# Patient Record
Sex: Female | Born: 1995 | Race: White | Hispanic: No | Marital: Single | Smoking: Never smoker
Health system: Southern US, Community
[De-identification: ages and names within clinical notes are randomized; demographics above are authoritative.]

---
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
## 2019-04-08 ENCOUNTER — Other Ambulatory Visit: Payer: Self-pay

## 2019-04-08 ENCOUNTER — Ambulatory Visit (HOSPITAL_COMMUNITY)
Admission: RE | Admit: 2019-04-08 | Discharge: 2019-04-08 | Disposition: A | Discharge status: Home / Self Care | Payer: BLUE CROSS/BLUE SHIELD | Source: Home / Self Care | Attending: Psychiatry | Admitting: Psychiatry

## 2019-04-08 DIAGNOSIS — R45851 Suicidal ideations: Secondary | ICD-10-CM | POA: Insufficient documentation

## 2019-04-08 DIAGNOSIS — F419 Anxiety disorder, unspecified: Secondary | ICD-10-CM | POA: Insufficient documentation

## 2019-04-08 DIAGNOSIS — F329 Major depressive disorder, single episode, unspecified: Secondary | ICD-10-CM | POA: Insufficient documentation

## 2019-04-08 DIAGNOSIS — F1210 Cannabis abuse, uncomplicated: Secondary | ICD-10-CM | POA: Diagnosis not present

## 2019-04-08 DIAGNOSIS — Z79899 Other long term (current) drug therapy: Secondary | ICD-10-CM | POA: Insufficient documentation

## 2019-04-08 DIAGNOSIS — Z046 Encounter for general psychiatric examination, requested by authority: Secondary | ICD-10-CM | POA: Diagnosis not present

## 2019-04-08 NOTE — BH Assessment (Signed)
Patient is a walk in at [facility].  Patient meets criteria for inpatient psychiatric hospitalization,  Writer will be transported to [facility] due to no appropriate beds at [facility].  TTS will seek placement.  Writer informed Scientist, research (physical sciences) at [employer].

## 2019-04-09 ENCOUNTER — Encounter (HOSPITAL_COMMUNITY): Payer: Self-pay | Admitting: Emergency Medicine

## 2019-04-09 ENCOUNTER — Emergency Department (HOSPITAL_COMMUNITY)
Admission: EM | Admit: 2019-04-09 | Discharge: 2019-04-09 | Discharge status: Against Medical Advice | Payer: BLUE CROSS/BLUE SHIELD | Attending: Emergency Medicine | Admitting: Emergency Medicine

## 2019-04-09 ENCOUNTER — Other Ambulatory Visit: Payer: Self-pay

## 2019-04-09 DIAGNOSIS — F322 Major depressive disorder, single episode, severe without psychotic features: Secondary | ICD-10-CM | POA: Diagnosis present

## 2019-04-09 DIAGNOSIS — F411 Generalized anxiety disorder: Secondary | ICD-10-CM | POA: Diagnosis present

## 2019-04-09 DIAGNOSIS — R45851 Suicidal ideations: Secondary | ICD-10-CM

## 2019-04-09 DIAGNOSIS — F419 Anxiety disorder, unspecified: Secondary | ICD-10-CM

## 2019-04-09 LAB — COMPREHENSIVE METABOLIC PANEL
ALT: 18 U/L (ref 0–44)
AST: 21 U/L (ref 15–41)
Albumin: 4.8 g/dL (ref 3.5–5.0)
Alkaline Phosphatase: 54 U/L (ref 38–126)
Anion gap: 11 (ref 5–15)
BUN: 12 mg/dL (ref 6–20)
CO2: 23 mmol/L (ref 22–32)
Calcium: 9.9 mg/dL (ref 8.9–10.3)
Chloride: 105 mmol/L (ref 98–111)
Creatinine, Ser: 0.83 mg/dL (ref 0.44–1.00)
GFR calc Af Amer: >60 mL/min (ref >60)
GFR calc non Af Amer: >60 mL/min (ref >60)
Glucose, Bld: 98 mg/dL (ref 70–99)
Potassium: 3.7 mmol/L (ref 3.5–5.1)
Sodium: 139 mmol/L (ref 135–145)
Total Bilirubin: 0.4 mg/dL (ref 0.3–1.2)
Total Protein: 7.9 g/dL (ref 6.5–8.1)

## 2019-04-09 LAB — CBC
HCT: 40.8 % (ref 36.0–46.0)
Hemoglobin: 13.1 g/dL (ref 12.0–15.0)
MCH: 29.8 pg (ref 26.0–34.0)
MCHC: 32.1 g/dL (ref 30.0–36.0)
MCV: 92.9 fL (ref 80.0–100.0)
Platelets: 240 10*3/uL (ref 150–400)
RBC: 4.39 MIL/uL (ref 3.87–5.11)
RDW: 12.5 % (ref 11.5–15.5)
WBC: 10.3 10*3/uL (ref 4.0–10.5)
nRBC: 0 % (ref 0.0–0.2)

## 2019-04-09 LAB — RAPID URINE DRUG SCREEN, HOSP PERFORMED
Amphetamines: NOT DETECTED
Barbiturates: NOT DETECTED
Benzodiazepines: NOT DETECTED
Cocaine: NOT DETECTED
Opiates: NOT DETECTED
Tetrahydrocannabinol: POSITIVE — AB

## 2019-04-09 LAB — ETHANOL: Alcohol, Ethyl (B): <10 mg/dL (ref <10)

## 2019-04-09 LAB — I-STAT BETA HCG BLOOD, ED (MC, WL, AP ONLY): I-stat hCG, quantitative: <5 m[IU]/mL (ref <5)

## 2019-04-09 MED ORDER — HYDROXYZINE HCL 10 MG PO TABS
10.0000 mg | ORAL_TABLET | Freq: Three times a day (TID) | ORAL | Status: DC | PRN
  Administered 2019-04-09: 10 mg via ORAL
  Filled 2019-04-09: qty 1

## 2019-04-09 MED ORDER — TRAZODONE HCL 50 MG PO TABS: 50.0000 mg | ORAL_TABLET | Freq: Every day | ORAL | Status: DC

## 2019-04-09 MED ORDER — LORAZEPAM 0.5 MG PO TABS: 0.5000 mg | ORAL_TABLET | Freq: Three times a day (TID) | ORAL | Status: DC | PRN

## 2019-04-09 MED ORDER — ZOLPIDEM TARTRATE 5 MG PO TABS: 5.0000 mg | ORAL_TABLET | Freq: Every evening | ORAL | Status: DC | PRN

## 2019-04-09 MED ORDER — IBUPROFEN 200 MG PO TABS: 600.0000 mg | ORAL_TABLET | Freq: Three times a day (TID) | ORAL | Status: DC | PRN

## 2019-04-09 MED ORDER — ONDANSETRON HCL 4 MG PO TABS: 4.0000 mg | ORAL_TABLET | Freq: Three times a day (TID) | ORAL | Status: DC | PRN

## 2019-04-09 MED ORDER — FLUOXETINE HCL 10 MG PO CAPS
10.0000 mg | ORAL_CAPSULE | Freq: Every day | ORAL | Status: DC
  Administered 2019-04-09: 10 mg via ORAL
  Filled 2019-04-09: qty 1
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
## 2019-04-09 NOTE — ED Notes (Signed)
Discharge instructions and resources reviewed with pt. Pt verbalizes understanding and states that she will call resources in the morning. Pt was provided with belongings.

## 2019-04-09 NOTE — ED Notes (Signed)
She tells me that the anxiety med I gave her earlier "helped-I feel a little better". I assist her with the making of a phone call at this time.

## 2019-04-09 NOTE — ED Notes (Addendum)
Pt given warm blankets and PO fluids. Pt has call bell within reach. Denies needing anything at this time.

## 2019-04-09 NOTE — ED Provider Notes (Signed)
[facility] DEPT Provider Note   CSN: [id number] Arrival date & time: 04/08/19  2354     History   Chief Complaint Chief Complaint  Patient presents with  . Anxiety  . Depression    HPI Kristina Bates is a 23 y.o. female.     Patient presents to the emergency department after being seen at behavioral health earlier tonight.  She was referred here because it was felt that she required inpatient psychiatric treatment.  Patient reports that she has been experiencing several months of increasing anxiety which causes her "sadness".  She does admit that she has had thoughts of suicide but does not have an active plan currently.     History reviewed. No pertinent past medical history.  There are no active problems to display for this patient.   History reviewed. No pertinent surgical history.   OB History   No obstetric history on file.      Home Medications    Prior to Admission medications   Medication Sig Start Date End Date Taking? Authorizing Provider  meloxicam (MOBIC) 15 MG tablet Take 1 tablet (15 mg total) by mouth daily. 09/02/16   Cuthriell, Charline Bills, PA-C  methocarbamol (ROBAXIN) 500 MG tablet Take 1 tablet (500 mg total) by mouth 4 (four) times daily. 09/02/16   Cuthriell, Charline Bills, PA-C    Family History History reviewed. No pertinent family history.  Social History Social History   Tobacco Use  . Smoking status: Never Smoker  . Smokeless tobacco: Never Used  Substance Use Topics  . Alcohol use: No  . Drug use: Never     Allergies   Patient has no known allergies.   Review of Systems Review of Systems  Psychiatric/Behavioral: Positive for dysphoric mood and suicidal ideas. The patient is nervous/anxious.   All other systems reviewed and are negative.    Physical Exam Updated Vital Signs BP 125/83 (BP Location: Left Arm)   Pulse 80   Temp 98.7 F (37.1 C) (Oral)   Resp 15   Ht 5\' 5"  (1.651 m)   Wt  49.9 kg   LMP 03/18/2019   SpO2 99%   BMI 18.30 kg/m   Physical Exam Vitals signs and nursing note reviewed.  Constitutional:      General: She is not in acute distress.    Appearance: Normal appearance. She is well-developed.  HENT:     Head: Normocephalic and atraumatic.     Right Ear: Hearing normal.     Left Ear: Hearing normal.     Nose: Nose normal.  Eyes:     Conjunctiva/sclera: Conjunctivae normal.     Pupils: Pupils are equal, round, and reactive to light.  Neck:     Musculoskeletal: Normal range of motion and neck supple.  Cardiovascular:     Rate and Rhythm: Regular rhythm.     Heart sounds: S1 normal and S2 normal. No murmur. No friction rub. No gallop.   Pulmonary:     Effort: Pulmonary effort is normal. No respiratory distress.     Breath sounds: Normal breath sounds.  Chest:     Chest wall: No tenderness.  Abdominal:     General: Bowel sounds are normal.     Palpations: Abdomen is soft.     Tenderness: There is no abdominal tenderness. There is no guarding or rebound. Negative signs include Murphy's sign and McBurney's sign.     Hernia: No hernia is present.  Musculoskeletal: Normal range of motion.  Skin:    General: Skin is warm and dry.     Findings: No rash.  Neurological:     Mental Status: She is alert and oriented to person, place, and time.     GCS: GCS eye subscore is 4. GCS verbal subscore is 5. GCS motor subscore is 6.     Cranial Nerves: No cranial nerve deficit.     Sensory: No sensory deficit.     Coordination: Coordination normal.  Psychiatric:        Mood and Affect: Mood is depressed.        Speech: Speech is delayed.        Behavior: Behavior is slowed and withdrawn.        Thought Content: Thought content normal.      ED Treatments / Results  Labs (all labs ordered are listed, but only abnormal results are displayed) Labs Reviewed  RAPID URINE DRUG SCREEN, HOSP PERFORMED - Abnormal; Notable for the following components:       Result Value   Tetrahydrocannabinol POSITIVE (*)    All other components within normal limits  CBC  COMPREHENSIVE METABOLIC PANEL  ETHANOL  I-STAT BETA HCG BLOOD, ED ([facility], [facility], [facility] ONLY)    EKG None  Radiology No results found.  Procedures Procedures (including critical care time)  Medications Ordered in ED Medications - No data to display   Initial Impression / Assessment and Plan / ED Course  I have reviewed the triage vital signs and the nursing notes.  Pertinent labs & imaging results that were available during my care of the patient were reviewed by me and considered in my medical decision making (see chart for details).        Patient with 2 or more month history of increased anxiety, sadness, depression and intermittent suicidal ideation.  She will require psychiatric evaluation and treatment.  Patient medically clear for psychiatric treatment.  Final Clinical Impressions(s) / ED Diagnoses   Final diagnoses:  Anxiety  Suicidal ideation    ED Discharge Orders    None       Pollina, Canary Brim, MD 04/09/19 0121

## 2019-04-09 NOTE — Progress Notes (Signed)
Pt meets inpatient criteria per Lindon Romp, NP. Referral information has been sent to the following hospitals for review:  [facility]       Disposition will continue to assist with inpatient placement needs.   Audree Camel, LCSW, [place] Disposition [facility]/TTS [phone]

## 2019-04-09 NOTE — ED Triage Notes (Signed)
Patient is complaining of anxiety. Patient states she can not sleep. Patient also states she is depressed and really sad. Patient came from behavorial health.

## 2019-04-09 NOTE — Consult Note (Addendum)
Patient reassessed today and continues to meet criteria for inpatient admission.  [facility] does not have available beds.  SW continues to seek accepting facility for placement.  Patient seen face-to-face for psychiatric evaluation, chart reviewed and case discussed with the physician extender and developed treatment plan. Reviewed the information documented and agree with the treatment plan. Corena Pilgrim, MD

## 2019-04-09 NOTE — ED Notes (Signed)
She remains in good spirits and has completed her Telepsych. eval.

## 2019-04-09 NOTE — ED Notes (Signed)
Her skin is normal, warm and dry and she is breathing normally. She is sleeping soundly, which I allow her to continue to do.

## 2019-04-09 NOTE — ED Notes (Signed)
Pt boyfriend asked Probation officer of this note if pt could leave or if she had to stay. Dr Melina Copa was notified, writer of this note and Dr. Melina Copa went into room and discussed options with pt. Pt is still wanting to leave AMA.

## 2019-04-09 NOTE — BH Assessment (Signed)
Show:Clear all [] Manual[x] Template[] Copied  Added by: [x] , LCAS-A  [] Hover for details Tele Assessment Note   Patient Name: Kristina Bates MRN: Clyde Canterbury Location of Patient: Walk In at [facility]  Location of Provider: [facility]    Patient is a 23 year old female.  Patient is a walk in at [facility].  Patient was accompanied by her live-in boyfriend.  Patient endorsed SI without a plan.  Patient reports having a plan to harm herself last month; however, the patient would not tell me what the plan was to harm herself.  Patient reports increased anxiety attacks.  Patient reports irritability and starting arguments with her boyfriend.  Patient reports that everything causes her anxiety.  Patient reports that she cannot identify any specific trauma that is causing her depression.  Patient is a [employer] at 36 n history.  Patient reports that her grades have dropped and she has no motivation to get out of the bed.  Patient denes any family supports.  Patient denies prior inpatient psychiatric hospitalization.  Patient denies HI/Psychos/Substance Abuse.  Patient denies medication management.     Diagnosis: Major Depressive Disorder   Past Medical History: No past medical history on file.  No past surgical history on file.  Family History: No family history on file.  Social History:  reports that she has never smoked. She has never used smokeless tobacco. She reports that she does not drink alcohol or use drugs.  Additional Social History:  Alcohol / Drug Use History of alcohol / drug use?: No history of alcohol / drug abuse  CIWA:   COWS:    Allergies: No Known Allergies  Home Medications: (Not in a hospital admission)   OB/GYN Status:  Patient's last menstrual period was 03/18/2019.  General Assessment Data Location of Assessment: [facility] Assessment Services TTS Assessment: In system Is this a Tele or Face-to-Face Assessment?: Face-to-Face Is  this an Initial Assessment or a Re-assessment for this encounter?: Initial Assessment Patient Accompanied by:: Other Language Other than English: No Living Arrangements: Other (Comment) What gender do you identify as?: Female Marital status: Single Maiden name: NA Pregnancy Status: No Living Arrangements: (Lives with her girlfriend. ) Can pt return to current living arrangement?: Yes Admission Status: Voluntary Is patient capable of signing voluntary admission?: Yes Referral Source: Self/Family/Friend  Medical Screening Exam [facility] Walk-in ONLY) Medical Exam completed: Yes(Jason [employer], NP )  Crisis Care Plan Living Arrangements: (Lives with her girlfriend. ) Legal Guardian: (NA)  Education Status Is patient currently in school?: Yes(Junior at [employer]) Current Grade: 12th Highest grade of school patient has completed: 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 : Yes Suicidal Intent: Yes-Currently Present Has patient had any suicidal intent within the past 6 months prior to admission? : Yes Is patient at risk for suicide?: Yes Suicidal Plan?: Yes-Currently Present Has patient had any suicidal plan within the past 6 months prior to admission? : Yes Specify Current Suicidal Plan: Patient would not state what her plan was Access to Means: Yes Specify Access to Suicidal Means: Patient would not state What has been your use of drugs/alcohol within the last 12 months?: None Reported Previous Attempts/Gestures: Yes How many times?: 0 Other Self Harm Risks: NA Triggers for  Past Attempts: None known Intentional Self Injurious Behavior: None Family Suicide History: No Recent stressful life event(s): Other (Comment) Persecutory  voices/beliefs?: No Depression: Yes Depression Symptoms: Despondent, Insomnia, Tearfulness, Isolating, Fatigue, Guilt, Loss of interest in usual pleasures, Feeling worthless/self pity Substance abuse history and/or treatment for substance abuse?: No Suicide prevention information given to non-admitted patients: Not applicable  Risk to Others within the past 6 months Homicidal Ideation: No Does patient have any lifetime risk of violence toward others beyond the six months prior to admission? : No Thoughts of Harm to Others: No Current Homicidal Intent: No Current Homicidal Plan: No Access to Homicidal Means: No Identified Victim: None Reported History of harm to others?: No Assessment of Violence: None Noted Violent Behavior Description: None Reported Does patient have access to weapons?: No Criminal Charges Pending?: No Does patient have a court date: No Is patient on probation?: No  Psychosis Hallucinations: None noted Delusions: None noted  Mental Status Report Appearance/Hygiene: Disheveled, Layered clothes, Poor hygiene Eye Contact: Poor Motor Activity: Freedom of movement Speech: Logical/coherent Level of Consciousness: Alert Mood: Depressed, Anxious Affect: Anxious, Depressed Anxiety Level: Moderate Thought Processes: Coherent, Relevant Judgement: Unimpaired Orientation: Person, Place, Time, Situation Obsessive Compulsive Thoughts/Behaviors: None  Cognitive Functioning Concentration: Decreased Memory: Recent Intact, Remote Intact Is patient IDD: No Insight: Fair Impulse Control: Poor Appetite: Poor Have you had any weight changes? : No Change Sleep: Increased Total Hours of Sleep: 10 Vegetative Symptoms: Staying in bed, Not bathing, Decreased grooming  ADLScreening [facility] Assessment Services) Patient's cognitive ability adequate to safely complete daily activities?: Yes Patient able to express need for assistance with ADLs?: Yes Independently performs  ADLs?: Yes (appropriate for developmental age)  Prior Inpatient Therapy Prior Inpatient Therapy: No  Prior Outpatient Therapy Prior Outpatient Therapy: No Does patient have an ACCT team?: No Does patient have Intensive In-House Services?  : No Does patient have [facility] services? : No Does patient have P4CC services?: No  ADL Screening (condition at time of admission) Patient's cognitive ability adequate to safely complete daily activities?: Yes Is the patient deaf or have difficulty hearing?: No Does the patient have difficulty seeing, even when wearing glasses/contacts?: No Does the patient have difficulty concentrating, remembering, or making decisions?: Yes Patient able to express need for assistance with ADLs?: Yes Does the patient have difficulty dressing or bathing?: No Independently performs ADLs?: Yes (appropriate for developmental age) Does the patient have difficulty walking or climbing stairs?: No Weakness of Legs: None Weakness of Arms/Hands: None  Home Assistive Devices/Equipment Home Assistive Devices/Equipment: None  Abuse/Neglect Assessment (Assessment to be complete while patient is alone) Abuse/Neglect Assessment Can Be Completed: (None Reported) Advance Directives (For Healthcare) Does Patient Have a Medical Advance Directive?: No Would patient like information on creating a medical advance directive?: No - Patient declined    Disposition: Inpt per Corene Cornea, NP  Disposition Initial Assessment Completed for this Encounter: Yes Disposition of Patient: (Per Corene Cornea, NP - meets criteria for inpt hosp)  This service was provided via telemedicine using a 2-way, interactive audio and video technology.  Names of all persons participating in this telemedicine service and their role in this encounter. Name: Graciella Freer  Role: [place], [place]   Name: Kristina Bates  Role: Patient           Rene Paci 04/09/2019 12:49 AM

## 2019-04-09 NOTE — ED Notes (Signed)
She is awake, alert and oriented. She has eaten breakfast. She denies s.i./h.i. and tells me she is here mainly "for anxiety--it's just getting kinda out of control" [sic]. She is very pleased to get some hot chocolate.

## 2019-04-09 NOTE — ED Notes (Signed)
Pt ambulatory to BR with steady gait. No assist needed.

## 2019-04-09 NOTE — ED Provider Notes (Signed)
I was informed by nursing that the patient would like to be discharged.  She says her anxiety is getting too bad and she needs to be with somebody and cannot stay here any longer.  I reviewed the notes from behavioral health and it sounds like she is considered meeting inpatient criteria.  The patient states she does not want to stay and I do not think I have enough to IVC her.  Will discharge with a list of resources and she understands she can return if she feels any worse.   Hayden Rasmussen, MD 04/09/19 Lurena Nida

## 2019-06-06 ENCOUNTER — Ambulatory Visit: Payer: BLUE CROSS/BLUE SHIELD | Attending: Internal Medicine

## 2019-06-06 DIAGNOSIS — Z20822 Contact with and (suspected) exposure to covid-19: Secondary | ICD-10-CM

## 2019-06-07 LAB — NOVEL CORONAVIRUS, NAA: SARS-CoV-2, NAA: NOT DETECTED

## 2019-06-09 NOTE — L&D Delivery Note (Signed)
Delivery Note  Date of delivery: 01/13/2020 Estimated Date of Delivery: 01/14/20 Patient's last menstrual period was 03/18/2019. EGA: [redacted]w[redacted]d  Delivery Note At 6:45 PM a viable female was delivered via Vaginal, Spontaneous (Presentation: OA).  APGAR: 8, 9; weight 6lb15.8oz (3170g).    Placenta status: Spontaneous, Intact.  Cord: 3 vessels with the following complications:none.  First Stage: Labor onset: 1400 Augmentation : AROM Analgesia /Anesthesia intrapartum: none AROM at 1740  Talbert Cage presented to L&D with active labor.    Second Stage: Complete dilation at 1800 Onset of pushing at 1800 FHR second stage Cat II Delivery at 1845 on 01/13/2020  She progressed to complete and had a spontaneous vaginal birth of a live female over an intact perineum. The fetal head was delivered in OA position with restitution to LOA. No nuchal cord. Anterior then posterior shoulders delivered spontaneously. Baby placed on mom's abdomen and attended to by transition RN. Cord clamped and cut when pulseless by FOB. Cord blood was not obtained for newborn labs.  Third Stage: Placenta delivered  intact with 3VC at 1906 Placenta disposition: Routine disposal Uterine tone firm / bleeding min IV pitocin given for hemorrhage prophylaxis  Left labial laceration identified  Anesthesia for repair: none Repair 3.0 vicryl Est. Blood Loss (mL): 300  Complications: none  Mom to postpartum.  Baby to Couplet care / Skin to Skin.  Newborn: Birth Weight: 6lb15.8oz (3170g)  Apgar Scores: 8, 9 Feeding planned: Breast   Cyril Mourning, CNM 01/13/2020 7:27 PM

## 2019-06-15 ENCOUNTER — Ambulatory Visit: Payer: BLUE CROSS/BLUE SHIELD | Attending: Internal Medicine

## 2019-06-15 DIAGNOSIS — Z20822 Contact with and (suspected) exposure to covid-19: Secondary | ICD-10-CM

## 2019-06-17 LAB — NOVEL CORONAVIRUS, NAA: SARS-CoV-2, NAA: NOT DETECTED

## 2019-07-31 ENCOUNTER — Ambulatory Visit: Payer: Medicaid Other | Attending: Internal Medicine

## 2019-07-31 DIAGNOSIS — Z20822 Contact with and (suspected) exposure to covid-19: Secondary | ICD-10-CM | POA: Insufficient documentation

## 2019-08-01 LAB — NOVEL CORONAVIRUS, NAA: SARS-CoV-2, NAA: NOT DETECTED

## 2020-01-13 ENCOUNTER — Inpatient Hospital Stay
Admission: EM | Admit: 2020-01-13 | Discharge: 2020-01-15 | DRG: 806 | Disposition: A | Discharge status: Home / Self Care | Payer: Medicaid Other | Attending: Obstetrics and Gynecology | Admitting: Obstetrics and Gynecology

## 2020-01-13 DIAGNOSIS — O26893 Other specified pregnancy related conditions, third trimester: Secondary | ICD-10-CM | POA: Diagnosis present

## 2020-01-13 DIAGNOSIS — Z6791 Unspecified blood type, Rh negative: Secondary | ICD-10-CM

## 2020-01-13 DIAGNOSIS — O99824 Streptococcus B carrier state complicating childbirth: Secondary | ICD-10-CM | POA: Diagnosis present

## 2020-01-13 DIAGNOSIS — Z20822 Contact with and (suspected) exposure to covid-19: Secondary | ICD-10-CM | POA: Diagnosis present

## 2020-01-13 DIAGNOSIS — D62 Acute posthemorrhagic anemia: Secondary | ICD-10-CM | POA: Diagnosis not present

## 2020-01-13 DIAGNOSIS — Z3A39 39 weeks gestation of pregnancy: Secondary | ICD-10-CM

## 2020-01-13 DIAGNOSIS — F329 Major depressive disorder, single episode, unspecified: Secondary | ICD-10-CM | POA: Diagnosis present

## 2020-01-13 DIAGNOSIS — O9081 Anemia of the puerperium: Secondary | ICD-10-CM | POA: Diagnosis not present

## 2020-01-13 DIAGNOSIS — O99344 Other mental disorders complicating childbirth: Secondary | ICD-10-CM | POA: Diagnosis present

## 2020-01-13 LAB — CBC
HCT: 38 % (ref 36.0–46.0)
Hemoglobin: 12.5 g/dL (ref 12.0–15.0)
MCH: 29.7 pg (ref 26.0–34.0)
MCHC: 32.9 g/dL (ref 30.0–36.0)
MCV: 90.3 fL (ref 80.0–100.0)
Platelets: 240 10*3/uL (ref 150–400)
RBC: 4.21 MIL/uL (ref 3.87–5.11)
RDW: 15 % (ref 11.5–15.5)
WBC: 14.9 10*3/uL — ABNORMAL HIGH (ref 4.0–10.5)
nRBC: 0 % (ref 0.0–0.2)

## 2020-01-13 LAB — TYPE AND SCREEN
ABO/RH(D): A NEG
Antibody Screen: POSITIVE

## 2020-01-13 LAB — SARS CORONAVIRUS 2 BY RT PCR (HOSPITAL ORDER, PERFORMED IN ~~LOC~~ HOSPITAL LAB): SARS Coronavirus 2: NEGATIVE

## 2020-01-13 MED ORDER — LIDOCAINE HCL (PF) 1 % IJ SOLN
INTRAMUSCULAR | Status: AC
  Filled 2020-01-13: qty 30

## 2020-01-13 MED ORDER — ACETAMINOPHEN 325 MG PO TABS: 650.0000 mg | ORAL_TABLET | ORAL | Status: DC | PRN

## 2020-01-13 MED ORDER — OXYCODONE HCL 5 MG PO TABS
5.0000 mg | ORAL_TABLET | ORAL | Status: DC | PRN
  Administered 2020-01-13: 5 mg via ORAL
  Filled 2020-01-13: qty 1

## 2020-01-13 MED ORDER — OXYCODONE HCL 5 MG PO TABS: 10.0000 mg | ORAL_TABLET | ORAL | Status: DC | PRN

## 2020-01-13 MED ORDER — WITCH HAZEL-GLYCERIN EX PADS
1.0000 "application " | MEDICATED_PAD | CUTANEOUS | Status: DC | PRN
  Filled 2020-01-13 (×2): qty 100

## 2020-01-13 MED ORDER — LACTATED RINGERS IV SOLN: INTRAVENOUS | Status: DC

## 2020-01-13 MED ORDER — COCONUT OIL OIL: 1.0000 "application " | TOPICAL_OIL | Status: DC | PRN

## 2020-01-13 MED ORDER — ONDANSETRON HCL 4 MG PO TABS
4.0000 mg | ORAL_TABLET | ORAL | Status: DC | PRN
  Filled 2020-01-13: qty 1

## 2020-01-13 MED ORDER — OXYCODONE-ACETAMINOPHEN 5-325 MG PO TABS: 2.0000 | ORAL_TABLET | ORAL | Status: DC | PRN

## 2020-01-13 MED ORDER — SOD CITRATE-CITRIC ACID 500-334 MG/5ML PO SOLN: 30.0000 mL | ORAL | Status: DC | PRN

## 2020-01-13 MED ORDER — DIPHENHYDRAMINE HCL 25 MG PO CAPS: 25.0000 mg | ORAL_CAPSULE | Freq: Four times a day (QID) | ORAL | Status: DC | PRN

## 2020-01-13 MED ORDER — SIMETHICONE 80 MG PO CHEW: 80.0000 mg | CHEWABLE_TABLET | ORAL | Status: DC | PRN

## 2020-01-13 MED ORDER — AMMONIA AROMATIC IN INHA
RESPIRATORY_TRACT | Status: AC
  Filled 2020-01-13: qty 10

## 2020-01-13 MED ORDER — ONDANSETRON HCL 4 MG/2ML IJ SOLN: 4.0000 mg | INTRAMUSCULAR | Status: DC | PRN

## 2020-01-13 MED ORDER — OXYTOCIN BOLUS FROM INFUSION: 333.0000 mL | Freq: Once | INTRAVENOUS | Status: DC

## 2020-01-13 MED ORDER — DIBUCAINE (PERIANAL) 1 % EX OINT
1.0000 "application " | TOPICAL_OINTMENT | CUTANEOUS | Status: DC | PRN
  Filled 2020-01-13 (×2): qty 28

## 2020-01-13 MED ORDER — ONDANSETRON HCL 4 MG/2ML IJ SOLN: 4.0000 mg | Freq: Four times a day (QID) | INTRAMUSCULAR | Status: DC | PRN

## 2020-01-13 MED ORDER — OXYTOCIN 10 UNIT/ML IJ SOLN
INTRAMUSCULAR | Status: AC
  Administered 2020-01-13: 10 [IU]
  Filled 2020-01-13: qty 2

## 2020-01-13 MED ORDER — OXYTOCIN-SODIUM CHLORIDE 30-0.9 UT/500ML-% IV SOLN: 2.5000 [IU]/h | INTRAVENOUS | Status: DC

## 2020-01-13 MED ORDER — TETANUS-DIPHTH-ACELL PERTUSSIS 5-2.5-18.5 LF-MCG/0.5 IM SUSP
0.5000 mL | Freq: Once | INTRAMUSCULAR | Status: DC
  Filled 2020-01-13: qty 0.5

## 2020-01-13 MED ORDER — FERROUS SULFATE 325 (65 FE) MG PO TABS
325.0000 mg | ORAL_TABLET | Freq: Two times a day (BID) | ORAL | Status: DC
  Administered 2020-01-14 – 2020-01-15 (×3): 325 mg via ORAL
  Filled 2020-01-13 (×3): qty 1

## 2020-01-13 MED ORDER — DOCUSATE SODIUM 100 MG PO CAPS
100.0000 mg | ORAL_CAPSULE | Freq: Two times a day (BID) | ORAL | Status: DC
  Administered 2020-01-13 – 2020-01-14 (×2): 100 mg via ORAL
  Filled 2020-01-13 (×4): qty 1

## 2020-01-13 MED ORDER — IBUPROFEN 600 MG PO TABS
600.0000 mg | ORAL_TABLET | Freq: Four times a day (QID) | ORAL | Status: DC
  Administered 2020-01-13 – 2020-01-15 (×8): 600 mg via ORAL
  Filled 2020-01-13 (×8): qty 1

## 2020-01-13 MED ORDER — LACTATED RINGERS IV SOLN: 500.0000 mL | INTRAVENOUS | Status: DC | PRN

## 2020-01-13 MED ORDER — PRENATAL MULTIVITAMIN CH
1.0000 | ORAL_TABLET | Freq: Every day | ORAL | Status: DC
  Administered 2020-01-14 – 2020-01-15 (×2): 1 via ORAL
  Filled 2020-01-13 (×2): qty 1

## 2020-01-13 MED ORDER — OXYCODONE-ACETAMINOPHEN 5-325 MG PO TABS: 1.0000 | ORAL_TABLET | ORAL | Status: DC | PRN

## 2020-01-13 MED ORDER — LIDOCAINE HCL (PF) 1 % IJ SOLN
30.0000 mL | INTRAMUSCULAR | Status: DC | PRN
  Filled 2020-01-13: qty 30

## 2020-01-13 MED ORDER — BENZOCAINE-MENTHOL 20-0.5 % EX AERO
1.0000 "application " | INHALATION_SPRAY | CUTANEOUS | Status: DC | PRN
  Filled 2020-01-13: qty 56

## 2020-01-13 MED ORDER — MISOPROSTOL 200 MCG PO TABS
ORAL_TABLET | ORAL | Status: AC
  Filled 2020-01-13: qty 4

## 2020-01-13 MED ORDER — ACETAMINOPHEN 325 MG PO TABS
650.0000 mg | ORAL_TABLET | ORAL | Status: DC | PRN
  Administered 2020-01-13: 650 mg via ORAL
  Filled 2020-01-13: qty 2

## 2020-01-13 NOTE — Discharge Summary (Signed)
Obstetrical Discharge Summary  Patient Name: Kristina Bates DOB: Mar 04, 1996 MRN: [id number]  Date of Admission: 01/13/2020 Date of Delivery: 01/13/20 Delivered by: Haroldine Laws Date of Discharge: 01/15/2020  Primary OB: [facility]  [id number] last menstrual period was 03/18/2019. EDC Estimated Date of Delivery: 01/14/20 Gestational Age at Delivery: [redacted]w[redacted]d   Antepartum complications:  1. GBS Positive 2. Needs PP pap smear 3. Late to care at 31wks 4. Depression - stopped taking prenatally  5. Underweight pre- preg BMI 18 6. Chlamydia tx sent 6/9 7. RH Negative 8. Anemia Admitting Diagnosis: active labor Secondary Diagnosis: Patient Active Problem List   Diagnosis Date Noted  . NSVD (normal spontaneous vaginal delivery) 01/13/2020  . Generalized anxiety disorder 04/09/2019  . Major depressive disorder, single episode, severe (HCC) 04/09/2019    Augmentation: N/A Complications: None  Intrapartum complications/course:  Delivery Type: spontaneous vaginal delivery Anesthesia: none Placenta: spontaneous Laceration: left labial Episiotomy: none Newborn Data: Live born female  Birth Weight:  6lb15.8oz (3170g)  APGAR: 8, 9  "Kristina Bates"  Newborn Delivery   Birth date/time: 01/13/2020 18:45:00 Delivery type: Vaginal, Spontaneous      24yo [id number] at 39+4wks presenting with active labor, AROM with clear fluid.  She progressed to complete and pushed over an intact perineum and delivered the fetal head, followed promptly by the shoulders. She was in control the whole time, and the baby placed on the maternal abdomen. Delayed cord clamping and the FOB cut his cord, while he was skin to skin. The placenta delivered spontaneously and intact. Left labial laceration noted and repaired. Mom and baby tolerated the procedure well.   Postpartum Procedures: None  Post partum course:  Patient had an uncomplicated postpartum course.  By time of discharge on PPD#2, her pain was  controlled on oral pain medications; she had appropriate lochia and was ambulating, voiding without difficulty and tolerating regular diet.  TOC evaluated for resources and concerns for postpartum mood risk factors.  She declined to stay for psychiatry consult and was given resources for counselors.  will start back on Prozac postpartum.  She was deemed stable for discharge to home.    Discharge Physical Exam:  BP 102/71 (BP Location: Left Arm)   Pulse 79   Temp 98.3 F (36.8 C) (Oral)   Resp 18   LMP 03/18/2019   SpO2 100%   Breastfeeding Unknown   General: alert and no distress Pulm: normal respiratory effort Lochia: appropriate Abdomen: soft, NT Uterine Fundus: firm, below umbilicus Extremities: No evidence of DVT seen on physical exam. No lower extremity edema. EdinburghInocente Salles Postnatal Depression Scale Screening Tool 01/14/2020  I have been able to laugh and see the funny side of things. 0  I have looked forward with enjoyment to things. 0  I have blamed myself unnecessarily when things went wrong. 0  I have been anxious or worried for no good reason. 0  I have felt scared or panicky for no good reason. 1  Things have been getting on top of me. 0  I have been so unhappy that I have had difficulty sleeping. 0  I have felt sad or miserable. 0  I have been so unhappy that I have been crying. 1  The thought of harming myself has occurred to me. 0  Edinburgh Postnatal Depression Scale Total 2     Labs: CBC Latest Ref Rng & Units 01/14/2020 01/13/2020 04/09/2019  WBC 4.0 - 10.5 K/uL 12.1(H) 14.9(H) 10.3  Hemoglobin 12.0 - 15.0 g/dL 11.4(L) 12.5  13.1  Hematocrit 36 - 46 % 34.8(L) 38.0 40.8  Platelets 150 - 400 K/uL 224 240 240   A NEG Hemoglobin  Date Value Ref Range Status  01/14/2020 11.4 (L) 12.0 - 15.0 g/dL Final   HCT  Date Value Ref Range Status  01/14/2020 34.8 (L) 36 - 46 % Final    Disposition: stable, discharge to home Baby Feeding: breastmilk Baby  Disposition: home with mom  Contraception: birth control pills though still considering options   Prenatal Labs:  Blood type/Rh A neg  Antibody screen neg  Rubella Immune  Varicella Immune  RPR NR  HBsAg Neg  HIV NR  GC neg  Chlamydia Pos on 6/9, neg on 7/12  Genetic screening negative  1 hour GTT   3 hour GTT   GBS pos    Rh Immune globulin given: Mom A neg/Infant A pos - Rhogam given 01/15/2020 Rubella vaccine given: n/a Varicella vaccine given: n/a Tdap vaccine given in AP or PP setting: given 11/13/19 Flu vaccine given in AP or PP setting: Declined  Plan: Kristina Bates was discharged to home in good condition. Follow-up appointment with delivering provider in 2 weeks for mood check.  Discharge Instructions: Per After Visit Summary. Activity: Advance as tolerated. Pelvic rest for 6 weeks.   Diet: Regular Discharge Medications: Allergies as of 01/15/2020   No Known Allergies     Medication List    STOP taking these medications   meloxicam 15 MG tablet Commonly known as: MOBIC   methocarbamol 500 MG tablet Commonly known as: Robaxin     TAKE these medications   acetaminophen 325 MG tablet Commonly known as: Tylenol Take 2 tablets (650 mg total) by mouth every 4 (four) hours as needed (for pain scale < 4).   FLUoxetine 20 MG capsule Commonly known as: PROZAC Take 1 capsule (20 mg total) by mouth daily.   ibuprofen 600 MG tablet Commonly known as: ADVIL Take 1 tablet (600 mg total) by mouth every 6 (six) hours as needed for mild pain or moderate pain.   prenatal vitamin w/FE, FA 29-1 MG Chew chewable tablet Chew 1 tablet by mouth daily at 12 noon.      Outpatient follow up:   Follow-up Information    Haroldine Laws, CNM. Schedule an appointment as soon as possible for a visit in 2 week(s).   Specialty: Certified Nurse Midwife Why: Please call to schedule a 2 week mood check appointment and 6 week postpartum appointment Contact information: [address] [place] [address] [phone]               Signed:  Margaretmary Eddy, CNM Certified Nurse Midwife [facility] [place]

## 2020-01-13 NOTE — OB Triage Note (Signed)
Patient here for contractions that started a few hours ago, every 4-5 mins, is having some bleeding. Denies LOF

## 2020-01-13 NOTE — Plan of Care (Signed)
Patient delivered baby girl "[name]" vaginally without complications. Patients only complaint at this time is pain in perineal area. Patient given scheduled and PRN medications. Patient made aware of scheduled medications and to request PRN medications as needed. Patient educated on the need to urinate, is unable to urinate at this time. FOB ([place]) at bedside for support.

## 2020-01-13 NOTE — H&P (Signed)
OB History & Physical   History of Present Illness:  Chief Complaint:   HPI:  Kristina Bates is a 24 y.o. G24P1001 female at [redacted]w[redacted]d dated by LMP.  She presents to L&D for active labor  She reports:  -active fetal movement -no leakage of fluid -bloody show upon arrival -onset of contractions at 1400 currently every 4-5 minutes  Pregnancy Issues: 1. GBS Positive 2. Needs PP pap smear 3. Late to care at 31wks 4. Depression   on Prozac 40mg  PO daily 5. Underweight pre- preg BMI 18 6. Chlamydia tx sent 6/9 7. RH Negative 8. Anemia  Maternal Medical History:  History reviewed. No pertinent past medical history.  History reviewed. No pertinent surgical history.  No Known Allergies  Prior to Admission medications   Medication Sig Start Date End Date Taking? Authorizing Provider  meloxicam (MOBIC) 15 MG tablet Take 1 tablet (15 mg total) by mouth daily. 09/02/16   Cuthriell, 09/04/16, PA-C  methocarbamol (ROBAXIN) 500 MG tablet Take 1 tablet (500 mg total) by mouth 4 (four) times daily. 09/02/16   Cuthriell, 09/04/16, PA-C     Prenatal care site: [facility]   Social History: She  reports that she has never smoked. She has never used smokeless tobacco. She reports that she does not drink alcohol and does not use drugs.  Family History: family history is not on file.   Review of Systems: A full review of systems was performed and negative except as noted in the HPI.    Physical Exam:  Vital Signs: BP 113/62   Pulse 71   LMP 03/18/2019   Breastfeeding Unknown   General:   alert and cooperative  Skin:  normal  Neurologic:    Alert & oriented x 3  Lungs:   nl effort  Heart:   regular rate and rhythm  Abdomen:  normal findings: soft, non-tender  Extremities: : non-tender, symmetric     Results for orders placed or performed during the hospital encounter of 01/13/20 (from the past 24 hour(s))  CBC     Status: Abnormal   Collection Time: 01/13/20  7:50 PM   Result Value Ref Range   WBC 14.9 (H) 4.0 - 10.5 K/uL   RBC 4.21 3.87 - 5.11 MIL/uL   Hemoglobin 12.5 12.0 - 15.0 g/dL   HCT 03/14/20 36 - 46 %   MCV 90.3 80.0 - 100.0 fL   MCH 29.7 26.0 - 34.0 pg   MCHC 32.9 30.0 - 36.0 g/dL   RDW 16.1 09.6 - 04.5 %   Platelets 240 150 - 400 K/uL   nRBC 0.0 0.0 - 0.2 %  Type and screen [facility]     Status: None (Preliminary result)   Collection Time: 01/13/20  7:50 PM  Result Value Ref Range   ABO/RH(D) PENDING    Antibody Screen PENDING    Sample Expiration      01/16/2020,2359 Performed at [facility], [address] [place], [place]     Pertinent Results:  Prenatal Labs: Blood type/Rh A neg  Antibody screen neg  Rubella Immune  Varicella Immune  RPR NR  HBsAg Neg  HIV NR  GC neg  Chlamydia Pos on 6/9, neg on 7/12  Genetic screening negative  1 hour GTT   3 hour GTT   GBS pos   FHT: FHR: 135 bpm, variability: moderate,  accelerations:  Present,  decelerations:  Absent  Category/reactivity:  Category I TOCO: regular, every  2-4 minutes   Cephalic by SVE   Assessment:  Kristina Bates is a 24 y.o. G39P1001 female at [redacted]w[redacted]d with active labor.   Plan:  1. Admit to Labor & Delivery; consents reviewed and obtained  2. Fetal Well being  - Fetal Tracing: Cat I - GBS pos - Presentation: vtx confirmed by sve   3. Routine OB: - Prenatal labs reviewed, as above - Rh neg - CBC & T&S on admit - saline lock  4. Monitoring of Labor -  Contractions external toco in place -  Plan for continuous fetal monitoring  -  Maternal pain control as desired: IVPM, nitrous, regional anesthesia - Anticipate vaginal delivery  5. Post Partum Planning: - Infant feeding: Breast - Contraception: TBD - Tdap 11/13/19 - Flu declined  Jeyden Coffelt, CNM 01/13/2020 8:26 PM

## 2020-01-13 NOTE — Discharge Instructions (Signed)
Postpartum Care After Vaginal Delivery This sheet gives you information about how to care for yourself from the time you deliver your baby to up to 6-12 weeks after delivery (postpartum period). Your health care provider may also give you more specific instructions. If you have problems or questions, contact your health care provider. Follow these instructions at home: Vaginal bleeding  It is normal to have vaginal bleeding (lochia) after delivery. Wear a sanitary pad for vaginal bleeding and discharge. ? During the first week after delivery, the amount and appearance of lochia is often similar to a menstrual period. ? Over the next few weeks, it will gradually decrease to a dry, yellow-brown discharge. ? For most women, lochia stops completely by 4-6 weeks after delivery. Vaginal bleeding can vary from woman to woman.  Change your sanitary pads frequently. Watch for any changes in your flow, such as: ? A sudden increase in volume. ? A change in color. ? Large blood clots.  If you pass a blood clot from your vagina, save it and call your health care provider to discuss. Do not flush blood clots down the toilet before talking with your health care provider.  Do not use tampons or douches until your health care provider says this is safe.  If you are not breastfeeding, your period should return 6-8 weeks after delivery. If you are feeding your child breast milk only (exclusive breastfeeding), your period may not return until you stop breastfeeding. Perineal care  Keep the area between the vagina and the anus (perineum) clean and dry as told by your health care provider. Use medicated pads and pain-relieving sprays and creams as directed.  If you had a cut in the perineum (episiotomy) or a tear in the vagina, check the area for signs of infection until you are healed. Check for: ? More redness, swelling, or pain. ? Fluid or blood coming from the cut or tear. ? Warmth. ? Pus or a bad  smell.  You may be given a squirt bottle to use instead of wiping to clean the perineum area after you go to the bathroom. As you start healing, you may use the squirt bottle before wiping yourself. Make sure to wipe gently.  To relieve pain caused by an episiotomy, a tear in the vagina, or swollen veins in the anus (hemorrhoids), try taking a warm sitz bath 2-3 times a day. A sitz bath is a warm water bath that is taken while you are sitting down. The water should only come up to your hips and should cover your buttocks. Breast care  Within the first few days after delivery, your breasts may feel heavy, full, and uncomfortable (breast engorgement). Milk may also leak from your breasts. Your health care provider can suggest ways to help relieve the discomfort. Breast engorgement should go away within a few days.  If you are breastfeeding: ? Wear a bra that supports your breasts and fits you well. ? Keep your nipples clean and dry. Apply creams and ointments as told by your health care provider. ? You may need to use breast pads to absorb milk that leaks from your breasts. ? You may have uterine contractions every time you breastfeed for up to several weeks after delivery. Uterine contractions help your uterus return to its normal size. ? If you have any problems with breastfeeding, work with your health care provider or lactation consultant.  If you are not breastfeeding: ? Avoid touching your breasts a lot. Doing this can make   your breasts produce more milk. ? Wear a good-fitting bra and use cold packs to help with swelling. ? Do not squeeze out (express) milk. This causes you to make more milk. Intimacy and sexuality  Ask your health care provider when you can engage in sexual activity. This may depend on: ? Your risk of infection. ? How fast you are healing. ? Your comfort and desire to engage in sexual activity.  You are able to get pregnant after delivery, even if you have not had  your period. If desired, talk with your health care provider about methods of birth control (contraception). Medicines  Take over-the-counter and prescription medicines only as told by your health care provider.  If you were prescribed an antibiotic medicine, take it as told by your health care provider. Do not stop taking the antibiotic even if you start to feel better. Activity  Gradually return to your normal activities as told by your health care provider. Ask your health care provider what activities are safe for you.  Rest as much as possible. Try to rest or take a nap while your baby is sleeping. Eating and drinking   Drink enough fluid to keep your urine pale yellow.  Eat high-fiber foods every day. These may help prevent or relieve constipation. High-fiber foods include: ? Whole grain cereals and breads. ? Brown rice. ? Beans. ? Fresh fruits and vegetables.  Do not try to lose weight quickly by cutting back on calories.  Take your prenatal vitamins until your postpartum checkup or until your health care provider tells you it is okay to stop. Lifestyle  Do not use any products that contain nicotine or tobacco, such as cigarettes and e-cigarettes. If you need help quitting, ask your health care provider.  Do not drink alcohol, especially if you are breastfeeding. General instructions  Keep all follow-up visits for you and your baby as told by your health care provider. Most women visit their health care provider for a postpartum checkup within the first 3-6 weeks after delivery. Contact a health care provider if:  You feel unable to cope with the changes that your child brings to your life, and these feelings do not go away.  You feel unusually sad or worried.  Your breasts become red, painful, or hard.  You have a fever.  You have trouble holding urine or keeping urine from leaking.  You have little or no interest in activities you used to enjoy.  You have not  breastfed at all and you have not had a menstrual period for 12 weeks after delivery.  You have stopped breastfeeding and you have not had a menstrual period for 12 weeks after you stopped breastfeeding.  You have questions about caring for yourself or your baby.  You pass a blood clot from your vagina. Get help right away if:  You have chest pain.  You have difficulty breathing.  You have sudden, severe leg pain.  You have severe pain or cramping in your lower abdomen.  You bleed from your vagina so much that you fill more than one sanitary pad in one hour. Bleeding should not be heavier than your heaviest period.  You develop a severe headache.  You faint.  You have blurred vision or spots in your vision.  You have bad-smelling vaginal discharge.  You have thoughts about hurting yourself or your baby. If you ever feel like you may hurt yourself or others, or have thoughts about taking your own life, get help   right away. You can go to the nearest emergency department or call:  Your local emergency services (911 in the [place]  A suicide crisis helpline, such as the National Suicide Prevention Lifeline at 1-800-273-8255. This is open 24 hours a day. Summary  The period of time right after you deliver your newborn up to 6-12 weeks after delivery is called the postpartum period.  Gradually return to your normal activities as told by your health care provider.  Keep all follow-up visits for you and your baby as told by your health care provider. This information is not intended to replace advice given to you by your health care provider. Make sure you discuss any questions you have with your health care provider. Document Revised: 05/28/2017 Document Reviewed: 03/08/2017 Elsevier Patient Education  2020 Else[employer] Inc[employer]  Postpartum Baby Blues The postpartum period begins right after the birth of a baby. During this time, there is often a lot of joy and excitement. It is also a  time of many changes in the life of the parents. No matter how many times a mother gives birth, each child brings new challenges to the family, including different ways of relating to one another. It is common to have feelings of excitement along with confusing changes in moods, emotions, and thoughts. You may feel happy one minute and sad or stressed the next. These feelings of sadness usually happen in the period right after you have your baby, and they go away within a week or two. This is called the "baby blues." What are the causes? There is no known cause of baby blues. It is likely caused by a combination of factors. However, changes in hormone levels after childbirth are believed to trigger some of the symptoms. Other factors that can play a role in these mood changes include:  Lack of sleep.  Stressful life events, such as poverty, caring for a loved one, or death of a loved one.  Genetics. What are the signs or symptoms? Symptoms of this condition include:  Brief changes in mood, such as going from extreme happiness to sadness.  Decreased concentration.  Difficulty sleeping.  Crying spells and tearfulness.  Loss of appetite.  Irritability.  Anxiety. If the symptoms of baby blues last for more than 2 weeks or become more severe, you may have postpartum depression. How is this diagnosed? This condition is diagnosed based on an evaluation of your symptoms. There are no medical or lab tests that lead to a diagnosis, but there are various questionnaires that a health care provider may use to identify women with the baby blues or postpartum depression. How is this treated? Treatment is not needed for this condition. The baby blues usually go away on their own in 1-2 weeks. Social support is often all that is needed. You will be encouraged to get adequate sleep and rest. Follow these instructions at home: Lifestyle      Get as much rest as you can. Take a nap when the baby  sleeps.  Exercise regularly as told by your health care provider. Some women find yoga and walking to be helpful.  Eat a balanced and nourishing diet. This includes plenty of fruits and vegetables, whole grains, and lean proteins.  Do little things that you enjoy. Have a cup of tea, take a bubble bath, read your favorite magazine, or listen to your favorite music.  Avoid alcohol.  Ask for help with household chores, cooking, grocery shopping, or running errands. Do not try to   do everything yourself. Consider hiring a postpartum doula to help. This is a professional who specializes in providing support to new mothers.  Try not to make any major life changes during pregnancy or right after giving birth. This can add stress. General instructions  Talk to people close to you about how you are feeling. Get support from your partner, family members, friends, or other new moms. You may want to join a support group.  Find ways to cope with stress. This may include: ? Writing your thoughts and feelings in a journal. ? Spending time outside. ? Spending time with people who make you laugh.  Try to stay positive in how you think. Think about the things you are grateful for.  Take over-the-counter and prescription medicines only as told by your health care provider.  Let your health care provider know if you have any concerns.  Keep all postpartum visits as told by your health care provider. This is important. Contact a health care provider if:  Your baby blues do not go away after 2 weeks. Get help right away if:  You have thoughts of taking your own life (suicidal thoughts).  You think you may harm the baby or other people.  You see or hear things that are not there (hallucinations). Summary  After giving birth, you may feel happy one minute and sad or stressed the next. Feelings of sadness that happen right after the baby is born and go away after a week or two are called the "baby  blues."  You can manage the baby blues by getting enough rest, eating a healthy diet, exercising, spending time with supportive people, and finding ways to cope with stress.  If feelings of sadness and stress last longer than 2 weeks or get in the way of caring for your baby, talk to your health care provider. This may mean you have postpartum depression. This information is not intended to replace advice given to you by your health care provider. Make sure you discuss any questions you have with your health care provider. Document Revised: 09/16/2018 Document Reviewed: 07/21/2016 Elsevier Patient Education  2020 Elsevier Inc.  

## 2020-01-14 ENCOUNTER — Other Ambulatory Visit: Payer: Self-pay

## 2020-01-14 LAB — RPR: RPR Ser Ql: NONREACTIVE

## 2020-01-14 LAB — FETAL SCREEN: Fetal Screen: NEGATIVE

## 2020-01-14 LAB — CBC
HCT: 34.8 % — ABNORMAL LOW (ref 36.0–46.0)
Hemoglobin: 11.4 g/dL — ABNORMAL LOW (ref 12.0–15.0)
MCH: 29.3 pg (ref 26.0–34.0)
MCHC: 32.8 g/dL (ref 30.0–36.0)
MCV: 89.5 fL (ref 80.0–100.0)
Platelets: 224 10*3/uL (ref 150–400)
RBC: 3.89 MIL/uL (ref 3.87–5.11)
RDW: 15.4 % (ref 11.5–15.5)
WBC: 12.1 10*3/uL — ABNORMAL HIGH (ref 4.0–10.5)
nRBC: 0 % (ref 0.0–0.2)

## 2020-01-14 NOTE — Plan of Care (Signed)
VSS. Alert and oriented with aprop. Affect;does not make good eye contact. Assessment WNL;Denies c/o. Fundus is firm at U/-1 with small amount of Lochia. Encouraged Pt. To dispose of Peri-Pads after she does peri care to prevent infection and she v/o. Two Peri pads laying open and on top of cart in bathroom. Instructed Dad to support Infant head when holding Infant as Infant head was not being supported and Dad v/o.

## 2020-01-14 NOTE — Progress Notes (Signed)
Pt. removed all bands from arm stating that they were too tight. Pt, infant, and FOB rebanded. Pt educated on importance of keeping bands on. Pt also refusing rhogam shot. Pt educated on importance of receiving rhogam. Pt wishes to talk to provider before getting rhogam. Will update provider.

## 2020-01-14 NOTE — Plan of Care (Signed)
Transferred to Room 337 Post Partum. Oriented to Room, Fall Prevention and POC. V/O.

## 2020-01-14 NOTE — Lactation Note (Addendum)
This note was copied from a baby's chart. Lactation Consultation Note  Patient Name: Kristina Bates OEVOJ'[id number] Date: 01/14/2020 Reason for consult: Follow-up assessment;Primapara;Term;Other (Comment) (Mom concerned about [name]'s cluster feeding - reassured)  LC has been assisting mom all day with breast feeding.  [name]  Is latching better and more independently with each breast feed.  Mom is becoming more comfortable with each breast feed.  FOB is supportive and helps mom with latching.  When went in for this feeding mom was concerned because [name] is cluster feeding.  She was sucking on her fingers and mom was hesitant to put her back to the breast reporting she had just breast fed her for 30 minutes plus on the left breast.  Explained this was normal.  Discussed feeding cues and supply and demand and encouraged mom to put [name] to the breast whenever she demonstrated feeding cues to bring in mature milk and ensure a plentiful milk supply.  Easily hand expressed a good amount of colostrum from right breast which was encouraging to mom.  FOB encouraged her to put her back and she latched immediately and began strong, rhythmic sucking with occasional audible swallows.  Mom voices some nipple tenderness.  No trauma noted.  Demonstrated hand expression of colostrum to rub on nipples after breast feed to prevent bacter, lubricate and help with tenderness.  Offered coconut oil and/or comfort gels.  Mom declined at this time, but said she would call out and ask for them if her colostrum was not helping enough.  Hand out given on what to expect with breast feeding the first 4 days of [name]'s life and discussed normal newborn stomach size, adequate intake and out put, normal course of lactation and routine newborn feeding patterns.  Lactation [employer]  given and reviewed.  Lactation name and number written on white board and encouraged to call with any questions, concerns or assistance. Maternal  Data Formula Feeding for Exclusion: No Has patient been taught Hand Expression?: Yes Does the patient have breastfeeding experience prior to this delivery?: No (Gr1)  Feeding Feeding Type: Breast Fed  LATCH Score Latch: Grasps breast easily, tongue down, lips flanged, rhythmical sucking.  Audible Swallowing: A few with stimulation  Type of Nipple: Everted at rest and after stimulation  Comfort (Breast/Nipple): Soft / non-tender  Hold (Positioning): No assistance needed to correctly position infant at breast.  LATCH Score: 9  Interventions Interventions: Breast feeding basics reviewed;Assisted with latch;Skin to skin;Breast massage;Hand express;Breast compression;Adjust position;Support pillows;Position options  Lactation Tools Discussed/Used WIC Program: Yes   Consult Status Consult Status: PRN    Louis Meckel 01/14/2020, 8:29 PM

## 2020-01-14 NOTE — Progress Notes (Addendum)
Post Partum Day 1  Subjective: Doing well, no concerns. Ambulating without difficulty, pain managed with PO meds, tolerating regular diet, and voiding without difficulty.   No fever/chills, chest pain, shortness of breath, nausea/vomiting, or leg pain. No nipple or breast pain.   Objective: BP 112/61 (BP Location: Right Arm)    Pulse 74    Temp 97.6 F (36.4 C) (Oral)    Resp 18    LMP 03/18/2019    SpO2 99%    Breastfeeding Unknown    Physical Exam:  General: alert and cooperative Breasts: soft/nontender CV: RRR Pulm: nl effort Abdomen: soft, non-tender Uterine Fundus: firm Perineum: minimal edema Lochia: appropriate DVT Evaluation: No evidence of DVT seen on physical exam. [place]: No flowsheet data found.   Recent Labs    01/13/20 1950 01/14/20 0515  HGB 12.5 11.4*  HCT 38.0 34.8*  WBC 14.9* 12.1*  PLT 240 224    Assessment/Plan: 24 y.o. G1P1001 postpartum day # 1  -Continue routine postpartum care -Lactation consult PRN for breastfeeding   -Acute blood loss anemia - hemodynamically stable and asymptomatic; start PO ferrous sulfate BID with stool softeners  -Immunization status: all immunizations up to date -home tomorrow d/t inadequately treated GBS  Disposition: Continue inpatient postpartum care    LOS: 1 day   Taris Galindo, CNM 01/14/2020, 10:26 AM

## 2020-01-15 MED ORDER — ACETAMINOPHEN 325 MG PO TABS: 650.0000 mg | ORAL_TABLET | ORAL | Status: AC | PRN

## 2020-01-15 MED ORDER — MEDROXYPROGESTERONE ACETATE 150 MG/ML IM SUSP
150.0000 mg | INTRAMUSCULAR | Status: DC | PRN
  Filled 2020-01-15: qty 1

## 2020-01-15 MED ORDER — IBUPROFEN 600 MG PO TABS: 600.0000 mg | ORAL_TABLET | Freq: Four times a day (QID) | ORAL | 3 refills | Status: AC | PRN

## 2020-01-15 MED ORDER — FLUOXETINE HCL 20 MG PO CAPS
20.0000 mg | ORAL_CAPSULE | Freq: Every day | ORAL | 0 refills | Status: AC
Start: 2020-01-15 — End: 2020-02-14

## 2020-01-15 MED ORDER — RHO D IMMUNE GLOBULIN 1500 UNIT/2ML IJ SOSY
300.0000 ug | PREFILLED_SYRINGE | Freq: Once | INTRAMUSCULAR | Status: AC
  Administered 2020-01-15: 300 ug via INTRAVENOUS
  Filled 2020-01-15: qty 2

## 2020-01-15 NOTE — Progress Notes (Signed)
Pt discharged with infant. Discharge instructions, prescriptions, and follow up appointments given to and reviewed with patient. Pt verbalized understanding. To be escorted out by auxillary.  °

## 2020-01-15 NOTE — TOC Initial Note (Signed)
Transition of Care [facility]) - Initial/Assessment Note    Patient Details  Name: Kristina Bates MRN: [id number] Date of Birth: 1995/06/23  Transition of Care [facility]) CM/SW Contact:    Marina Goodell Phone Number: [phone] 01/15/2020, 3:49 PM  Clinical Narrative:                  Patient delivered a baby girl, [name], on 01/13/2020.  Pt did not receive pre-natal car until [redacted] weeks gestation, was taking Prozac until delivery. TOC contacted by RN due to unidentified behavioral concerns with mother and father.  RN informed this CSW of the numerous times mother and had to be corrected how to properly hold the baby's head. Mother initially refused RH shot and refused abdominal massages, although she was informed of their importance.   CSW asked patient and baby's father about support systems, follow-up appointments for mother and baby, [facility] and Food Stamp applications, and community needs. Patient stated, and baby's father, stated they were okay and verbalized understanding of how to request ir access assistance.    CSW did observe the patient's affect was mildly blunted and this maybe due to not taking her Prozac.  During this time the baby woke up and CSW asked patient about feeding the baby, patient asked CSW to give her the baby, this CSW informed her that I was not allowed to pick the baby. Baby's father seem annoyed but did get up to pick the baby and changed the baby.  CSW observed for a few seconds and there was nothing to specifically concern this CSW about how he handled the baby.  CSW spoke with RN and recommended a psych consult for the patient since she was not currently taking her Prozac.  Patient and baby's father are not emoting as they maybe expect to do and there is a lack of enthusiasm about interacting with the baby.  This CSW did inform the RN, tha I could not find anything specific that I would be able to initiate a CPS report with, but that it maybe a good idea to inform the  pediatrician of the observations that have been made.        Patient Goals and CMS Choice        Expected Discharge Plan and Services                                                Prior Living Arrangements/Services                       Activities of Daily Living Home Assistive Devices/Equipment: None ADL Screening (condition at time of admission) Patient's cognitive ability adequate to safely complete daily activities?: Yes Is the patient deaf or have difficulty hearing?: No Does the patient have difficulty seeing, even when wearing glasses/contacts?: No Does the patient have difficulty concentrating, remembering, or making decisions?: No Patient able to express need for assistance with ADLs?: No Does the patient have difficulty dressing or bathing?: No Independently performs ADLs?: Yes (appropriate for developmental age) Communication: Independent Dressing (OT): Independent Grooming: Independent Feeding: Independent Bathing: Independent Toileting: Independent In/Out Bed: Independent Walks in Home: Independent Does the patient have difficulty walking or climbing stairs?: No Weakness of Legs: None Weakness of Arms/Hands: None  Permission Sought/Granted  Emotional Assessment              Admission diagnosis:  Indication for care in labor or delivery [O75.9] Patient Active Problem List   Diagnosis Date Noted  . NSVD (normal spontaneous vaginal delivery) 01/13/2020  . Generalized anxiety disorder 04/09/2019  . Major depressive disorder, single episode, severe (HCC) 04/09/2019   PCP:  Patient, No Pcp Per Pharmacy:   [id number] [place] Rough, [place] - [address] AT [facility] OF [address] [place] [address] Phone: [phone] Fax: [phone]     Social Determinants of Health (SDOH) Interventions    Readmission Risk Interventions No flowsheet data  found.

## 2020-01-15 NOTE — Progress Notes (Signed)
Pt refused fundal check at this time. Pt stated that she will give consent to an IV for the rhogam injection but not until later after she has had a nap. Pt asked to be left alone for a few hours to rest. Encouraged pt to call for RN if she needs anything and to let me know when she is ready for a fundal check and for her rhogam. Will check on patient in a few hours if pt does not call out.

## 2020-01-15 NOTE — Lactation Note (Signed)
This note was copied from a baby's chart. Lactation Consultation Note  Patient Name: Kristina Bates GSUPJ'S Date: 01/15/2020 Reason for consult: Follow-up assessment;Primapara;Term  Observed mom breast feeding [name] in laid back biological hold.  She had good deep latch and strong, rhythmic suck with occasional swallow. Mom breast fed her for 30 minutes.  Encouraged parents to call with any questions, concerns or assistance. maternal Data Formula Feeding for Exclusion: No Has patient been taught Hand Expression?: Yes Does the patient have breastfeeding experience prior to this delivery?: No (Gr1)  Feeding Feeding Type: Breast Fed  LATCH Score Latch: Repeated attempts needed to sustain latch, nipple held in mouth throughout feeding, stimulation needed to elicit sucking reflex.  Audible Swallowing: A few with stimulation  Type of Nipple: Everted at rest and after stimulation  Comfort (Breast/Nipple): Soft / non-tender  Hold (Positioning): No assistance needed to correctly position infant at breast.  LATCH Score: 8  Interventions Interventions: Hand express;Breast compression;Support pillows;Position options  Lactation Tools Discussed/Used WIC Program: Yes   Consult Status Consult Status: PRN    Louis Meckel 01/15/2020, 3:44 PM

## 2020-01-15 NOTE — Progress Notes (Signed)
Post Partum Day 2  Subjective: up ad lib, voiding and tolerating PO  Doing well, no concerns. Ambulating without difficulty, pain managed with PO meds, tolerating regular diet, and voiding without difficulty.   No fever/chills, chest pain, shortness of breath, nausea/vomiting, or leg pain. No nipple or breast pain. No headache, visual changes, or RUQ/epigastric pain.  Objective: BP 102/71 (BP Location: Left Arm)   Pulse 79   Temp 98.3 F (36.8 C) (Oral)   Resp 18   LMP 03/18/2019   SpO2 100%   Breastfeeding Unknown    Physical Exam:  General: alert, cooperative and no distress Breasts: soft/nontender CV: RRR Pulm: nl effort, CTABL Abdomen: soft, non-tender, active bowel sounds Uterine Fundus: firm Perineum: minimal edema, repair well approximated Lochia: appropriate DVT Evaluation: No evidence of DVT seen on physical exam.  Recent Labs    01/13/20 1950 01/14/20 0515  HGB 12.5 11.4*  HCT 38.0 34.8*  WBC 14.9* 12.1*  PLT 240 224    Assessment/Plan: 24 y.o. G1P1001 postpartum day # 2  -Continue routine postpartum care -Discussed contraceptive options including implant, IUDs hormonal and non-hormonal, injection, pills/ring/patch, condoms, and NFP. Considering COC's.  Cannot start until 6-8 weeks postpartum.  Considering depo provera injection prior to discharge.   -Declined rhogam injection.  States that it hurt too much in clinic.  Also reports that she is exhausted. Reviewed the reasons for RhoGam and the importance of the injection on future pregnancies.  States "I'm not planning on any more babies."  Reviewed that even though she was not planning on future pregnancies that it was important to receive the injection for prevention of harm to unplanned pregnancies.  She states that she would be willing to have an IV placed to receive the RhoGam injection IV.   -Aunika is exhausted.  Will allow for protected sleep and place IV/Rhogam later this afternoon.   Disposition:  Continue inpatient postpartum care. Desires discharge home today. Will be 48 hours at 1845.    LOS: 2 days   Gustavo Lah, Ina Homes 01/15/2020, 8:49 AM   ----- Margaretmary Eddy  Certified Nurse Midwife [facility]

## 2020-01-16 LAB — RHOGAM INJECTION: Unit division: 0

## 2020-04-03 ENCOUNTER — Emergency Department
Admission: EM | Admit: 2020-04-03 | Discharge: 2020-04-03 | Discharge status: Against Medical Advice | Payer: BLUE CROSS/BLUE SHIELD

## 2020-04-24 ENCOUNTER — Ambulatory Visit: Payer: BLUE CROSS/BLUE SHIELD

## 2020-05-28 ENCOUNTER — Ambulatory Visit: Payer: BLUE CROSS/BLUE SHIELD

## 2020-05-29 ENCOUNTER — Ambulatory Visit: Payer: BLUE CROSS/BLUE SHIELD

## 2021-09-22 ENCOUNTER — Encounter: Payer: Self-pay | Admitting: Emergency Medicine

## 2021-09-22 ENCOUNTER — Ambulatory Visit
Admission: EM | Admit: 2021-09-22 | Discharge: 2021-09-22 | Disposition: A | Discharge status: Home / Self Care | Payer: Medicaid Other | Attending: Emergency Medicine | Admitting: Emergency Medicine

## 2021-09-22 DIAGNOSIS — Z3201 Encounter for pregnancy test, result positive: Secondary | ICD-10-CM

## 2021-09-22 DIAGNOSIS — R300 Dysuria: Secondary | ICD-10-CM

## 2021-09-22 LAB — POCT URINALYSIS DIP (MANUAL ENTRY)
Bilirubin, UA: NEGATIVE
Blood, UA: NEGATIVE
Glucose, UA: NEGATIVE mg/dL
Leukocytes, UA: NEGATIVE
Nitrite, UA: POSITIVE — AB
Protein Ur, POC: NEGATIVE mg/dL
Spec Grav, UA: 1.02 (ref 1.010–1.025)
Urobilinogen, UA: 0.2 E.U./dL
pH, UA: 6.5 (ref 5.0–8.0)

## 2021-09-22 LAB — POCT URINE PREGNANCY: Preg Test, Ur: POSITIVE — AB

## 2021-09-22 MED ORDER — CEPHALEXIN 500 MG PO CAPS: 500.0000 mg | ORAL_CAPSULE | Freq: Two times a day (BID) | ORAL | 0 refills | Status: AC

## 2021-09-22 NOTE — Discharge Instructions (Addendum)
Take the antibiotic as directed.  The urine culture is pending.  We will call you if it shows the need to change or discontinue your antibiotic.   ? ?Follow up with your primary care provider or OB/GYN as soon as possible. ? ? ?   ? ?

## 2021-09-22 NOTE — ED Triage Notes (Signed)
Pt presents with dysuria and urinary frequency x 3 days  °

## 2021-09-22 NOTE — ED Provider Notes (Signed)
?[facility] ? ? ? ?CSN: [id number] ?Arrival date & time: 09/22/21  0240 ? ? ?  ? ?History   ?Chief Complaint ?Chief Complaint  ?Patient presents with  ? Back Pain  ? Urinary Frequency  ? Dysuria  ? ? ?HPI ?Kristina Bates is a 26 y.o. female.  Patient presents with 3-day history of dysuria and urinary frequency.  No fever, chills, abdominal pain, hematuria, vaginal discharge, pelvic pain, or other symptoms.  No treatments at home.  She is sexually active and would like STD testing.  Patient states she believes she is pregnant; she has missed 2 months of her menstrual cycle; she has not taken a pregnancy test yet.  LMP: unknown but she thinks maybe January 2023.  ? ?The history is provided by the patient.  ? ?History reviewed. No pertinent past medical history. ? ?Patient Active Problem List  ? Diagnosis Date Noted  ? NSVD (normal spontaneous vaginal delivery) 01/13/2020  ? Generalized anxiety disorder 04/09/2019  ? Major depressive disorder, single episode, severe (HCC) 04/09/2019  ? ? ?History reviewed. No pertinent surgical history. ? ?OB History   ? ? Gravida  ?2  ? Para  ?1  ? Term  ?1  ? Preterm  ?   ? AB  ?   ? Living  ?1  ?  ? ? SAB  ?   ? IAB  ?   ? Ectopic  ?   ? Multiple  ?0  ? Live Births  ?1  ?   ?  ?  ? ? ? ?Home Medications   ? ?Prior to Admission medications   ?Medication Sig Start Date End Date Taking? Authorizing Provider  ?cephALEXin (KEFLEX) 500 MG capsule Take 1 capsule (500 mg total) by mouth 2 (two) times daily for 5 days. 09/22/21 09/27/21 Yes Mickie Bail, NP  ?acetaminophen (TYLENOL) 325 MG tablet Take 2 tablets (650 mg total) by mouth every 4 (four) hours as needed (for pain scale < 4). 01/15/20   Gustavo Lah, CNM  ?FLUoxetine (PROZAC) 20 MG capsule Take 1 capsule (20 mg total) by mouth daily. 01/15/20 02/14/20  Gustavo Lah, CNM  ?ibuprofen (ADVIL) 600 MG tablet Take 1 tablet (600 mg total) by mouth every 6 (six) hours as needed for mild pain or moderate pain. 01/15/20   Gustavo Lah,  CNM  ?prenatal vitamin w/FE, FA (NATACHEW) 29-1 MG CHEW chewable tablet Chew 1 tablet by mouth daily at 12 noon.    [provider]  ? ? ?Family History ?No family history on file. ? ?Social History ?Social History  ? ?Tobacco Use  ? Smoking status: Never  ? Smokeless tobacco: Never  ?Vaping Use  ? Vaping Use: Never used  ?Substance Use Topics  ? Alcohol use: No  ? Drug use: Never  ? ? ? ?Allergies   ?Amoxicillin ? ? ?Review of Systems ?Review of Systems  ?Constitutional:  Negative for chills and fever.  ?Respiratory:  Negative for shortness of breath.   ?Gastrointestinal:  Negative for abdominal pain, diarrhea, nausea and vomiting.  ?Genitourinary:  Positive for dysuria and frequency. Negative for flank pain, hematuria, pelvic pain and vaginal discharge.  ?Skin:  Negative for color change and rash.  ?All other systems reviewed and are negative. ? ? ?Physical Exam ?Triage Vital Signs ?ED Triage Vitals  ?Enc Vitals Group  ?   BP   ?   Pulse   ?   Resp   ?   Temp   ?  Temp src   ?   SpO2   ?   Weight   ?   Height   ?   Head Circumference   ?   Peak Flow   ?   Pain Score   ?   Pain Loc   ?   Pain Edu?   ?   Excl. in GC?   ? ?No data found. ? ?Updated Vital Signs ?BP 123/74   Pulse 87   Temp 98.1 ?F (36.7 ?C)   Resp 18   SpO2 98%  ? ?Visual Acuity ?Right Eye Distance:   ?Left Eye Distance:   ?Bilateral Distance:   ? ?Right Eye Near:   ?Left Eye Near:    ?Bilateral Near:    ? ?Physical Exam ?Vitals and nursing note reviewed.  ?Constitutional:   ?   General: She is not in acute distress. ?   Appearance: She is well-developed. She is not ill-appearing.  ?HENT:  ?   Mouth/Throat:  ?   Mouth: Mucous membranes are moist.  ?Cardiovascular:  ?   Rate and Rhythm: Normal rate and regular rhythm.  ?   Heart sounds: Normal heart sounds.  ?Pulmonary:  ?   Effort: Pulmonary effort is normal. No respiratory distress.  ?   Breath sounds: Normal breath sounds.  ?Abdominal:  ?   General: Bowel sounds are normal.  ?    Palpations: Abdomen is soft.  ?   Tenderness: There is no abdominal tenderness. There is no right CVA tenderness, left CVA tenderness, guarding or rebound.  ?Musculoskeletal:  ?   Cervical back: Neck supple.  ?Skin: ?   General: Skin is warm and dry.  ?Neurological:  ?   Mental Status: She is alert.  ?Psychiatric:     ?   Mood and Affect: Mood normal.     ?   Behavior: Behavior normal.  ? ? ? ?UC Treatments / Results  ?Labs ?(all labs ordered are listed, but only abnormal results are displayed) ?Labs Reviewed  ?POCT URINE PREGNANCY - Abnormal; Notable for the following components:  ?    Result Value  ? Preg Test, Ur Positive (*)   ? All other components within normal limits  ?POCT URINALYSIS DIP (MANUAL ENTRY) - Abnormal; Notable for the following components:  ? Ketones, POC UA large (80) (*)   ? Nitrite, UA Positive (*)   ? All other components within normal limits  ?URINE CULTURE  ?CERVICOVAGINAL ANCILLARY ONLY  ? ? ?EKG ? ? ?Radiology ?No results found. ? ?Procedures ?Procedures (including critical care time) ? ?Medications Ordered in UC ?Medications - No data to display ? ?Initial Impression / Assessment and Plan / UC Course  ?I have reviewed the triage vital signs and the nursing notes. ? ?Pertinent labs & imaging results that were available during my care of the patient were reviewed by me and considered in my medical decision making (see chart for details). ? ? Positive pregnancy test, Dysuria.  Urine pregnancy test positive.  Patient is unsure of her LMP date.  Education provided on first and second trimester pregnancy.  Treating dysuria with Keflex. Urine culture pending. Discussed with patient that we will call her if the urine culture shows the need to change or discontinue the antibiotic. Instructed her to follow-up with her PCP or OB/GYN as soon as possible. She agrees to plan of care.    ? ? ?Final Clinical Impressions(s) / UC Diagnoses  ? ?Final diagnoses:  ?Positive pregnancy test  ?Dysuria   ? ? ? ?  Discharge Instructions   ? ?  ?Take the antibiotic as directed.  The urine culture is pending.  We will call you if it shows the need to change or discontinue your antibiotic.   ? ?Follow up with your primary care provider or OB/GYN as soon as possible. ? ? ?   ? ? ? ? ? ?ED Prescriptions   ? ? Medication Sig Dispense Auth. Provider  ? cephALEXin (KEFLEX) 500 MG capsule Take 1 capsule (500 mg total) by mouth 2 (two) times daily for 5 days. 10 capsule Mickie Bail, NP  ? ?  ? ?PDMP not reviewed this encounter. ?  ?Mickie Bail, NP ?09/22/21 [phone] ? ?

## 2021-09-23 LAB — CERVICOVAGINAL ANCILLARY ONLY
Bacterial Vaginitis (gardnerella): NEGATIVE
Candida Glabrata: NEGATIVE
Candida Vaginitis: NEGATIVE
Chlamydia: NEGATIVE
Comment: NEGATIVE
Comment: NEGATIVE
Comment: NEGATIVE
Comment: NEGATIVE
Comment: NEGATIVE
Comment: NORMAL
Neisseria Gonorrhea: NEGATIVE
Trichomonas: NEGATIVE

## 2021-09-23 LAB — URINE CULTURE: Culture: <10000 — AB
